# Patient Record
Sex: Female | Born: 1975 | Race: White | Hispanic: No | Marital: Single
Health system: Southern US, Community
[De-identification: ages and names within clinical notes are randomized; demographics above are authoritative.]

---

## 2003-07-05 ENCOUNTER — Other Ambulatory Visit: Payer: Self-pay

## 2004-11-12 ENCOUNTER — Emergency Department: Payer: Self-pay | Admitting: General Practice

## 2004-11-18 ENCOUNTER — Emergency Department: Payer: Self-pay | Admitting: Unknown Physician Specialty

## 2004-12-22 ENCOUNTER — Ambulatory Visit: Payer: Self-pay

## 2005-01-13 ENCOUNTER — Ambulatory Visit: Payer: Self-pay | Admitting: Surgery

## 2006-01-15 ENCOUNTER — Emergency Department: Payer: Self-pay | Admitting: Unknown Physician Specialty

## 2006-01-15 ENCOUNTER — Other Ambulatory Visit: Payer: Self-pay

## 2006-08-18 ENCOUNTER — Emergency Department: Payer: Self-pay | Admitting: Unknown Physician Specialty

## 2006-10-10 ENCOUNTER — Emergency Department: Payer: Self-pay | Admitting: Emergency Medicine

## 2007-03-12 ENCOUNTER — Emergency Department: Payer: Self-pay | Admitting: Emergency Medicine

## 2007-06-27 ENCOUNTER — Emergency Department: Payer: Self-pay | Admitting: Unknown Physician Specialty

## 2007-10-27 ENCOUNTER — Emergency Department: Payer: Self-pay | Admitting: Emergency Medicine

## 2009-09-22 ENCOUNTER — Emergency Department: Payer: Self-pay | Admitting: Internal Medicine

## 2010-04-26 ENCOUNTER — Emergency Department: Payer: Self-pay | Admitting: Emergency Medicine

## 2010-07-05 ENCOUNTER — Emergency Department: Payer: Self-pay | Admitting: Internal Medicine

## 2012-04-08 ENCOUNTER — Emergency Department: Payer: Self-pay | Admitting: Emergency Medicine

## 2013-05-22 ENCOUNTER — Observation Stay: Payer: Self-pay | Admitting: Internal Medicine

## 2013-05-22 LAB — COMPREHENSIVE METABOLIC PANEL
ALT: 24 U/L (ref 12–78)
ANION GAP: 3 — AB (ref 7–16)
Albumin: 3.4 g/dL (ref 3.4–5.0)
Alkaline Phosphatase: 71 U/L
BUN: 9 mg/dL (ref 7–18)
Bilirubin,Total: 0.3 mg/dL (ref 0.2–1.0)
Calcium, Total: 8.9 mg/dL (ref 8.5–10.1)
Chloride: 107 mmol/L (ref 98–107)
Co2: 26 mmol/L (ref 21–32)
Creatinine: 0.77 mg/dL (ref 0.60–1.30)
EGFR (African American): >60
Glucose: 119 mg/dL — ABNORMAL HIGH (ref 65–99)
Osmolality: 272 (ref 275–301)
Potassium: 4 mmol/L (ref 3.5–5.1)
SGOT(AST): 28 U/L (ref 15–37)
SODIUM: 136 mmol/L (ref 136–145)
TOTAL PROTEIN: 7.4 g/dL (ref 6.4–8.2)

## 2013-05-22 LAB — CBC WITH DIFFERENTIAL/PLATELET
BASOS PCT: 0.2 %
Basophil #: 0 10*3/uL (ref 0.0–0.1)
EOS ABS: 0 10*3/uL (ref 0.0–0.7)
Eosinophil %: 0.1 %
HCT: 41.3 % (ref 35.0–47.0)
HGB: 14.1 g/dL (ref 12.0–16.0)
LYMPHS ABS: 1.3 10*3/uL (ref 1.0–3.6)
LYMPHS PCT: 9.3 %
MCH: 33.1 pg (ref 26.0–34.0)
MCHC: 34.2 g/dL (ref 32.0–36.0)
MCV: 97 fL (ref 80–100)
Monocyte #: 0.5 x10 3/mm (ref 0.2–0.9)
Monocyte %: 3.4 %
Neutrophil #: 12.2 10*3/uL — ABNORMAL HIGH (ref 1.4–6.5)
Neutrophil %: 87 %
Platelet: 232 10*3/uL (ref 150–440)
RBC: 4.26 10*6/uL (ref 3.80–5.20)
RDW: 14.2 % (ref 11.5–14.5)
WBC: 14 10*3/uL — ABNORMAL HIGH (ref 3.6–11.0)

## 2013-05-22 LAB — CBC
HCT: 37.9 % (ref 35.0–47.0)
HGB: 13 g/dL (ref 12.0–16.0)
MCH: 32.9 pg (ref 26.0–34.0)
MCHC: 34.3 g/dL (ref 32.0–36.0)
MCV: 96 fL (ref 80–100)
Platelet: 230 10*3/uL (ref 150–440)
RBC: 3.94 10*6/uL (ref 3.80–5.20)
RDW: 13.8 % (ref 11.5–14.5)
WBC: 12 10*3/uL — ABNORMAL HIGH (ref 3.6–11.0)

## 2013-05-22 LAB — CLOSTRIDIUM DIFFICILE(ARMC)

## 2013-05-23 LAB — BASIC METABOLIC PANEL
Anion Gap: 5 — ABNORMAL LOW (ref 7–16)
BUN: 4 mg/dL — ABNORMAL LOW (ref 7–18)
CHLORIDE: 110 mmol/L — AB (ref 98–107)
CREATININE: 0.67 mg/dL (ref 0.60–1.30)
Calcium, Total: 8.2 mg/dL — ABNORMAL LOW (ref 8.5–10.1)
Co2: 26 mmol/L (ref 21–32)
EGFR (African American): >60
EGFR (Non-African Amer.): >60
Glucose: 83 mg/dL (ref 65–99)
OSMOLALITY: 277 (ref 275–301)
Potassium: 3.5 mmol/L (ref 3.5–5.1)
SODIUM: 141 mmol/L (ref 136–145)

## 2013-05-23 LAB — CBC WITH DIFFERENTIAL/PLATELET
BASOS PCT: 0.4 %
Basophil #: 0 10*3/uL (ref 0.0–0.1)
EOS ABS: 0 10*3/uL (ref 0.0–0.7)
EOS PCT: 0.3 %
HCT: 35.5 % (ref 35.0–47.0)
HGB: 11.8 g/dL — ABNORMAL LOW (ref 12.0–16.0)
Lymphocyte #: 3.3 10*3/uL (ref 1.0–3.6)
Lymphocyte %: 32.1 %
MCH: 32.4 pg (ref 26.0–34.0)
MCHC: 33.2 g/dL (ref 32.0–36.0)
MCV: 98 fL (ref 80–100)
MONO ABS: 0.8 x10 3/mm (ref 0.2–0.9)
Monocyte %: 8.2 %
NEUTROS ABS: 6 10*3/uL (ref 1.4–6.5)
Neutrophil %: 59 %
Platelet: 207 10*3/uL (ref 150–440)
RBC: 3.63 10*6/uL — ABNORMAL LOW (ref 3.80–5.20)
RDW: 14.1 % (ref 11.5–14.5)
WBC: 10.1 10*3/uL (ref 3.6–11.0)

## 2013-05-23 LAB — WBCS, STOOL

## 2013-05-23 LAB — HEMOGLOBIN: HGB: 12.2 g/dL (ref 12.0–16.0)

## 2013-05-24 LAB — CBC WITH DIFFERENTIAL/PLATELET
BASOS PCT: 0.8 %
Basophil #: 0.1 10*3/uL (ref 0.0–0.1)
Eosinophil #: 0.1 10*3/uL (ref 0.0–0.7)
Eosinophil %: 0.7 %
HCT: 36.6 % (ref 35.0–47.0)
HGB: 12.3 g/dL (ref 12.0–16.0)
Lymphocyte #: 2.5 10*3/uL (ref 1.0–3.6)
Lymphocyte %: 27.2 %
MCH: 32.6 pg (ref 26.0–34.0)
MCHC: 33.5 g/dL (ref 32.0–36.0)
MCV: 97 fL (ref 80–100)
MONO ABS: 0.9 x10 3/mm (ref 0.2–0.9)
MONOS PCT: 9.5 %
NEUTROS PCT: 61.8 %
Neutrophil #: 5.7 10*3/uL (ref 1.4–6.5)
PLATELETS: 214 10*3/uL (ref 150–440)
RBC: 3.76 10*6/uL — ABNORMAL LOW (ref 3.80–5.20)
RDW: 14 % (ref 11.5–14.5)
WBC: 9.2 10*3/uL (ref 3.6–11.0)

## 2013-05-24 LAB — STOOL CULTURE

## 2013-06-03 ENCOUNTER — Inpatient Hospital Stay: Payer: Self-pay | Admitting: Internal Medicine

## 2013-06-03 LAB — CBC WITH DIFFERENTIAL/PLATELET
Basophil #: 0.1 10*3/uL (ref 0.0–0.1)
Basophil %: 0.3 %
EOS ABS: 0.1 10*3/uL (ref 0.0–0.7)
Eosinophil %: 0.3 %
HCT: 49 % — ABNORMAL HIGH (ref 35.0–47.0)
HGB: 16 g/dL (ref 12.0–16.0)
Lymphocyte #: 0.5 10*3/uL — ABNORMAL LOW (ref 1.0–3.6)
Lymphocyte %: 2.5 %
MCH: 31.5 pg (ref 26.0–34.0)
MCHC: 32.6 g/dL (ref 32.0–36.0)
MCV: 97 fL (ref 80–100)
MONOS PCT: 2.7 %
Monocyte #: 0.6 x10 3/mm (ref 0.2–0.9)
Neutrophil #: 20.1 10*3/uL — ABNORMAL HIGH (ref 1.4–6.5)
Neutrophil %: 94.2 %
Platelet: 241 10*3/uL (ref 150–440)
RBC: 5.08 10*6/uL (ref 3.80–5.20)
RDW: 14 % (ref 11.5–14.5)
WBC: 21.3 10*3/uL — ABNORMAL HIGH (ref 3.6–11.0)

## 2013-06-03 LAB — COMPREHENSIVE METABOLIC PANEL
ALK PHOS: 69 U/L
Albumin: 4.1 g/dL (ref 3.4–5.0)
Anion Gap: 7 (ref 7–16)
BUN: 9 mg/dL (ref 7–18)
Bilirubin,Total: 0.6 mg/dL (ref 0.2–1.0)
CHLORIDE: 110 mmol/L — AB (ref 98–107)
CREATININE: 0.72 mg/dL (ref 0.60–1.30)
Calcium, Total: 9 mg/dL (ref 8.5–10.1)
Co2: 21 mmol/L (ref 21–32)
EGFR (Non-African Amer.): >60
Glucose: 110 mg/dL — ABNORMAL HIGH (ref 65–99)
Osmolality: 275 (ref 275–301)
POTASSIUM: 4 mmol/L (ref 3.5–5.1)
SGOT(AST): 18 U/L (ref 15–37)
SGPT (ALT): 16 U/L (ref 12–78)
SODIUM: 138 mmol/L (ref 136–145)
Total Protein: 8.1 g/dL (ref 6.4–8.2)

## 2013-06-03 LAB — URINALYSIS, COMPLETE
Bilirubin,UR: NEGATIVE
Glucose,UR: NEGATIVE mg/dL (ref 0–75)
Hyaline Cast: 2
LEUKOCYTE ESTERASE: NEGATIVE
Nitrite: NEGATIVE
PH: 5 (ref 4.5–8.0)
Protein: 30
RBC,UR: 6 /HPF (ref 0–5)
SPECIFIC GRAVITY: 1.024 (ref 1.003–1.030)
Squamous Epithelial: 16

## 2013-06-03 LAB — PREGNANCY, URINE: PREGNANCY TEST, URINE: NEGATIVE m[IU]/mL

## 2013-06-03 LAB — LIPASE, BLOOD: LIPASE: 173 U/L (ref 73–393)

## 2013-06-04 LAB — MAGNESIUM: Magnesium: 1.5 mg/dL — ABNORMAL LOW

## 2013-06-04 LAB — CBC WITH DIFFERENTIAL/PLATELET
BASOS ABS: 0 10*3/uL (ref 0.0–0.1)
BASOS PCT: 0.4 %
EOS PCT: 0.1 %
Eosinophil #: 0 10*3/uL (ref 0.0–0.7)
HCT: 38.4 % (ref 35.0–47.0)
HGB: 13 g/dL (ref 12.0–16.0)
LYMPHS PCT: 6.9 %
Lymphocyte #: 0.5 10*3/uL — ABNORMAL LOW (ref 1.0–3.6)
MCH: 32.3 pg (ref 26.0–34.0)
MCHC: 33.9 g/dL (ref 32.0–36.0)
MCV: 95 fL (ref 80–100)
MONO ABS: 0.1 x10 3/mm — AB (ref 0.2–0.9)
Monocyte %: 1.7 %
NEUTROS PCT: 90.9 %
Neutrophil #: 7.1 10*3/uL — ABNORMAL HIGH (ref 1.4–6.5)
PLATELETS: 165 10*3/uL (ref 150–440)
RBC: 4.03 10*6/uL (ref 3.80–5.20)
RDW: 13.8 % (ref 11.5–14.5)
WBC: 7.8 10*3/uL (ref 3.6–11.0)

## 2013-06-04 LAB — WBCS, STOOL

## 2013-06-04 LAB — BASIC METABOLIC PANEL
ANION GAP: 8 (ref 7–16)
BUN: 5 mg/dL — AB (ref 7–18)
CALCIUM: 7.9 mg/dL — AB (ref 8.5–10.1)
CO2: 21 mmol/L (ref 21–32)
Chloride: 107 mmol/L (ref 98–107)
Creatinine: 0.88 mg/dL (ref 0.60–1.30)
EGFR (African American): >60
EGFR (Non-African Amer.): >60
GLUCOSE: 86 mg/dL (ref 65–99)
Osmolality: 269 (ref 275–301)
POTASSIUM: 3.6 mmol/L (ref 3.5–5.1)
Sodium: 136 mmol/L (ref 136–145)

## 2013-06-04 LAB — OCCULT BLOOD X 1 CARD TO LAB, STOOL: Occult Blood, Feces: POSITIVE

## 2013-06-04 LAB — CLOSTRIDIUM DIFFICILE(ARMC)

## 2013-06-07 LAB — STOOL CULTURE

## 2014-08-02 NOTE — Consult Note (Signed)
PATIENT NAME:  Megan Ferguson, Megan Ferguson MR#:  [id number] DATE OF BIRTH:  March 27, 1976  DATE OF CONSULTATION:  05/23/2013  ATTENDING PHYSICIAN:  Dr. Renae Gloss CONSULTING PHYSICIAN:  Rodman Key, NP/Dr. Lutricia Feil  PRIMARY CARE PHYSICIAN:  None  REASON FOR CONSULTATION: Bloody diarrhea.  Megan Ferguson is a 39 year old Caucasian female who has essentially an unremarkable past medical history. The patient states that on Tuesday evening at around 8:00 that she started with a headache. At 2:30 that morning she woke up with severe abdominal cramping, diarrhea and vomiting occurring every 30 minutes. She states she was very weak, progressively became worse. At 4:30 a.m. states that she was so fatigued from the persistency of her GI symptoms that she ended up lying on the floor, then started with bright red blood only from her rectum. States that it was just water-like in consistency. At 8:00 that morning on the 11th, states that she continued to experience rectal bleeding with abdominal cramping bilaterally to lower abdomen. Described the pain as like having contractions. States her abdomen would get tight for a while then the pressure would subside. She has not had any evidence of rectal bleeding since yesterday or vomiting since being admitted. Continues with passage of mucus rectally with feeling of tenesmus as well. No fevers. Never has had symptoms similar to this in the past. No history of a colonoscopy or upper endoscopy.   The patient does state though that she bought pizza and chicken wings at 7:00 the evening prior to the onset of her symptoms and since being hospitalized she has found out that her sister and her nephew is also sick. Mother history of infectious colitis in the past. The patient does take BC powders as needed. The patient does confess to being sexually active inclusive of anal sex, most recent occurrence of anal sex was on Monday of this week. She is single with 3 children, ages 57, 59 and 62.   PAST  MEDICAL HISTORY: Unremarkable.   PAST SURGICAL HISTORY: Tubal ligation.   ALLERGIES: None.   MEDICATIONS: BC powders as needed, average 1 every one to two weeks. SOCIAL HISTORY: Smokes a pack of cigarettes a day.  Alcohol: Two to 3 beers a day. No history of recreational drug use. Unemployed, single, 3 children.  REVIEW OF SYSTEMS:  All 10 systems reviewed and checked, otherwise unremarkable other than what is stated above.   PHYSICAL EXAMINATION: VITAL SIGNS: Temperature 98.5 with a pulse of 59. Respirations are 18. Blood pressure is 114/76 with a pulse ox of 99% on room air.  GENERAL: Well-developed, well-nourished 39 year old Caucasian female, no acute distress noted. Pleasant. Appears to be resting comfortably in bed.  HEENT: Normocephalic, atraumatic. Pupils equal, reactive to light. Conjunctivae clear. Sclerae anicteric.  NECK: Supple. Trachea midline. No lymphadenopathy or thyromegaly.  PULMONARY: Symmetric rise and fall of chest. Clear to auscultation throughout.  CARDIOVASCULAR: Regular rhythm, S1, S2. No murmurs. No gallops.  ABDOMEN: Soft, nondistended. Bowel sounds in 4 quadrants. Marked discomfort throughout entire abdomen, more localized umbilically. No evidence of hepatosplenomegaly. No bruits. RECTAL:  Deferred. MUSCULOSKELETAL:  Moving all 4 extremities. No contractures. No clubbing.  EXTREMITIES: No edema.  PSYCHIATRIC: Alert and oriented x 4. Memory grossly intact. Appropriate affect and mood.  PSYCHIATRIC: No gross neurological deficits.   LABORATORY, DIAGNOSTIC AND RADIOLOGICAL DATA: Chemistry panel on admission: Glucose 119, anion gap is low at 3. Comparison today BUN has dropped from 9 to 4. Chloride has risen to 110. Anion gap has dropped to  5. Calcium is 8.2. Today hepatic panel within normal limits. WBC count on admission 14.0.  Neutrophil number elevated at 12.2. Yesterday evening white count was 12.0, today is normalized at 10.1.  RBC is 3.62 and hemoglobin has  dropped from 14.1 to 11.8. C. diff toxin A and B is negative. Stool comprehensive no pathologic E. coli. No Campylobacter but it is noted being held for possible pathogen.   CT scan of abdomen and pelvis with contrast was done yesterday at approximately 2:30. Lung bases are clear aside from air crystal in the right lower lobe measuring 2.1 cm. There is no significant pleural or pericardial effusion. There is a multiple loculated mass involving the interpolar region of the left kidney measuring 6.2 x 5.7 cm transverse and 4.9 cm cephalocaudad.  This mass is slightly heterogeneous in density and measures 72 HU.  The patient has reported that there has been a lesion in this area before that was 4.2 cm. Both adrenal glands appear normal. The liver, gallbladder, pancreas and spleen are normal. The renal veins and IVC appear normal. There is no retroperitoneal lymphadenopathy. Stomach, small bowel and appendix are normal. There is irregular colonic wall thickening in the region of the hepatic flexure of the colon. This appears greater than expected for incomplete distention. Findings concerning for possible renal neoplasm, as well as colitis.   The patient did have an MRI of abdomen done this afternoon and pending results.   IMPRESSION:  Acute onset of nausea, vomiting, generalized abdominal pain with bloody diarrhea. CT scan of abdomen and pelvis revealing findings consistent with colitis. Additionally abnormal finding concerning for renal mass. Differential inclusive of infectious, possible viral, possible early onset of chronic inflammatory bowel process. But again, the patient does state that the 2 of her family members who ate the same food are sick as well as this time.   PLAN: The patient's presentation will be discussed with Dr. Lutricia FeilPaul Oh and addendum will follow.   These services provided by Rodman Keyawn S. Mikeya Tomasetti, MS, APRN, [facility], FNP under collaborative agreement with Lutricia FeilPaul Oh, M.D.     ____________________________ Rodman Keyawn S. Aily Tzeng, NP dsh:ce D: 05/23/2013 14:26:03 ET T: 05/23/2013 14:44:05 ET JOB#: [id number]  cc: Rodman Keyawn S. Toma Arts, NP, <Dictator> Rodman KeyAWN S Tayna Smethurst MD ELECTRONICALLY SIGNED 05/24/2013 16:34

## 2014-08-02 NOTE — Consult Note (Signed)
PATIENT NAME:  Megan Ferguson, Megan Ferguson MR#:  [id number] DATE OF BIRTH:  Aug 31, 1975  GASTROENTEROLOGY INSuzzette Righter CONSULTATION NOTE  DATE OF CONSULTATION:  06/04/2013  REFERRING PHYSICIAN:  Dr. Renae GlossWieting.  CONSULTING PHYSICIAN:  Dow AdolphMatthew Zacharia Sowles, MD  REASON FOR THE CONSULT: Diarrhea, nausea.   HISTORY OF PRESENT ILLNESS: Megan Ferguson is a previously healthy 39 year old female who was presenting to the Emergency Room yesterday for evaluation of diarrhea, nausea and vomiting. Of note, Megan Ferguson was recently admitted to the hospital from February 11th through the 13th for acute onset of nausea, vomiting and diarrhea. During that admission, she was consulted on by Dr. Bluford Kaufmannh. This was felt to be an acute gastroenteritis and she improved while in the hospital.   Megan Ferguson reports that she went home and did okay for several days. Her stools formed up some and became mushy but never became completely solid. Over the past several days, she started to develop loose watery bowel movements. The day before, she passed about 4 liquid stools. Today she has had about 2 or 3 bowel movements. She also had significant nausea and vomiting and had a difficult time keeping down fluid yesterday.   Currently while in the hospital, her diarrhea has improved some. She has had 2 bowel movements today. Also, her nausea and vomiting have completely resolved. She otherwise is overall feeling well.   PAST MEDICAL HISTORY: None.   PAST SURGICAL HISTORY: Tubal ligation.   ALLERGIES: None.   DISCHARGE MEDICATIONS: She was discharged on 05/24/2013 with the following medications. She was on Cipro 500 mg two times a day for 8 days, Flagyl 500 mg 3 times a day for 8 days and Vicodin 1 tablet three times a day as needed.   SOCIAL HISTORY: She is a current smoker. She drinks about 2 beers per day.   REVIEW OF SYSTEMS: A 10-system review was conducted. It was negative except as stated in the HPI.   PHYSICAL EXAMINATION:  VITAL SIGNS: Current temperature  is 98.4. Pulse is 77. Blood pressure is 102/68. Her respirations are 14. Her pulse ox is 98% on 2 liters.  GENERAL: Alert and oriented times 4.  No acute distress. Appears stated age. HEENT: Normocephalic/atraumatic. Extraocular movements are intact. Anicteric. NECK: Soft, supple. JVP appears normal. No adenopathy. CHEST: Clear to auscultation. No wheeze or crackle. Respirations unlabored. HEART: Regular. No murmur, rub, or gallop.  Normal S1 and S2. ABDOMEN: Soft, nondistended.  Normal active bowel sounds in all four quadrants.  No organomegaly. No masses. Mild diffuse tenderness to palpation.  EXTREMITIES: No swelling, well perfused. SKIN: No rash or lesion. Skin color, texture, turgor normal. NEUROLOGICAL: Grossly intact. PSYCHIATRIC: Normal tone and affect. MUSCULOSKELETAL: No joint swelling or erythema.   LABORATORIES: Sodium 136, potassium 3.6, creatinine 0.88. BUN is 5. Her liver enzymes are normal. White count was 21 yesterday and 7.8 today. Hemoglobin is 13, hematocrit is 38, platelets are 265. She had a C. diff test which was negative. She had a KUB this admission which shows nondistended thick wall and bowel loops within the left abdomen, which may represent residual changes from this patient's known recent colitis.   ASSESSMENT: Diarrhea: It is interesting that her diarrhea got better after discharge and then has worsened in the past couple days. So far her Clostridium difficile is negative. We are waiting on ova and parasites in the stool culture. I suspect this will turn out not to be infectious in etiology. It is possible that this could be a postinfectious irritable bowel  syndrome-like picture; however, it would be important to rule out infection at this time. It does not fit with ischemia or inflammatory bowel disease.   PLAN:  1.  We will obtain the results on the ova and parasites in the stool culture. I am okay with going ahead and starting loperamide 2 mg. We will start  initially q.12 and increase as necessary. I do not feel that a colonoscopy or a CT scan is indicated at this time given she actually looks quite well clinically. I would consider downgrading the ertapenem back to Cipro, Flagyl, given her overall good clinical status.  2.  Nausea and vomiting. Fortunately, these have resolved completely. She can continue to take p.r.n. Zofran as needed.   Further recommendations depending upon the findings on stool studies and response to loperamide.   Thank you for this consult.   ____________________________ Dow Adolph, MD mr:np D: 06/04/2013 15:32:43 ET T: 06/04/2013 15:57:24 ET JOB#: [id number]  cc: Dow Adolph, MD, <Dictator> Kathalene Frames MD ELECTRONICALLY SIGNED 06/11/2013 13:50

## 2014-08-02 NOTE — Discharge Summary (Signed)
PATIENT NAME:  Megan Ferguson, Megan Ferguson MR#:  [id number] DATE OF BIRTH:  06-17-1975  DATE OF ADMISSION:  06/03/2013 DATE OF DISCHARGE:  06/05/2013  PRIMARY CARE PHYSICIAN:  [facility].   FINAL DIAGNOSES: 1.  Acute gastroenteritis likely bacterial.  2.  Left kidney mass.  3.  Hypomagnesemia.  4.  Tobacco abuse.   MEDICATIONS ON DISCHARGE: Include metronidazole 500 mg 1 tablet every 8 hours for 4 more days after what you already got at home, a total of 7 days; Cipro 500 mg 1 tablet every 12 hours for 4 more days more what you already got at home; loperamide 2 mg twice a day as needed for diarrhea, acetaminophen/hydrocodone 325/5 mg 1 tablet every 8 hours as needed for pain.   DIET: Low sodium diet, regular consistency.   ACTIVITY: As tolerated.  Follow-up with Dr. Bluford Kaufmannh or Dr. Shelle Ironein, gastroenterology, as outpatient. [facility] and 1 to 2 weeks at the [facility].   HOSPITAL COURSE: The patient was admitted 06/03/2013. The patient came in with abdominal pain, nausea and vomiting, unable to keep anything down.  HOSPITAL COURSE PER PROBLEM LIST:  1.  The patient has an acute gastroenteritis likely bacterial. They were recently in the hospital on 05/22/2013 and discharged 05/24/2013. She did feel like she was getting better and then ended up getting worse and then started vomiting and was unable to keep anything else down. She was tolerating diet on 06/05/2013. Initially she was put on IV Invanz. This was switched back over to Cipro and Flagyl and she will be extended on her course. Stool studies this time and last time were negative. Still awaiting ova and parasites. The patient was seen in consultation by Dr. Shelle Ironein, gastroenterology, who did not want to do a colonoscopy, conservative management.  2.  For the patient's left kidney mass that was seen last hospitalization by Dr. Artis FlockWolfe and [facility]. [facility] was calling her while she was in the Emergency  Room here. She will have to set up contact with them. 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  3.  Hypomagnesemia. This was replaced during the hospital course.  4.  Tobacco abuse. Smoking cessation counseling done last hospitalization.   The patient was prescribed 4 more days of antibiotics on the Cipro and Flagyl, and she already has 3 days at home. I think another 7 days would be perfectly fine. It is okay to take the loperamide since the patient does not have C. difficile. I did prescribe a small amount of pain medication.  TIME SPENT ON DISCHARGE: 35 minutes.   ____________________________ Herschell Dimesichard J. Renae GlossWieting, MD rjw:ce D: 06/05/2013 16:45:00 ET T: 06/05/2013 18:18:34 ET JOB#: [id number]  cc: Herschell Dimesichard J. Renae GlossWieting, MD, <Dictator> [facility] Salley ScarletICHARD J Mallie Giambra MD ELECTRONICALLY SIGNED 06/10/2013 13:28

## 2014-08-02 NOTE — H&P (Signed)
PATIENT NAME:  Megan Ferguson, Megan Ferguson MR#:  [id number] DATE OF BIRTH:  04-Jul-1975  DATE OF ADMISSION:  06/03/2013  PRIMARY CARE PHYSICIAN:  None.    CHIEF COMPLAINT: Abdominal pain, nausea, vomiting, diarrhea.   HISTORY OF PRESENT ILLNESS: This is a 39 year old female who was recently in the hospital for acute colitis from February 11th through February 13th. She was discharged home. She felt like the food that she ate just sat in her stomach. She thought she was getting a little bit better. Her bowel movements started to form up, then she had 2 days of diarrhea, just watery diarrhea. She had 5 bowel movements while here. Nausea, unable to keep any food down, unable to take her antibiotics. Abdominal pain in the top of her abdomen described as a constant nagging pain, 2 out of 10 in intensity but sometimes rages up to a 9 out of 10 in intensity. In the ER, she was found to have an elevated white count and hospitalist services were contacted for further evaluation.   PAST MEDICAL HISTORY: Recent admission for colitis, left renal mass suspicious for renal cell carcinoma and tobacco abuse.   PAST SURGICAL HISTORY: Tubal ligation.   ALLERGIES: No known drug allergies.   MEDICATIONS: Upon discharge last time include Vicodin as needed for pain, metronidazole 500 mg q.8 hours, and Cipro 500 mg q.12 hours for completion of course.   SOCIAL HISTORY: The patient does smoke cigarettes. She was counseled on the last hospitalization. Alcohol, does drink 2 to 3 drinks per day but has not since discharge. No drug use. Currently unemployed.   FAMILY HISTORY: Mother is healthy, father unknown.   REVIEW OF SYSTEMS: CONSTITUTIONAL: Positive for headache. Positive for chills and sweats. No fever. No weight gain, no weight loss.  EYES: No blurry vision.  EARS, NOSE, MOUTH AND THROAT: Positive for runny nose. No sore throat. No difficulty swallowing.  CARDIOVASCULAR: No chest pain. No palpitations.  RESPIRATORY: No  shortness of breath. No cough. No sputum. No hemoptysis.  GASTROINTESTINAL: Positive for nausea. Positive for vomiting. No hematemesis. Positive for abdominal pain. Positive for diarrhea. No bright red blood per rectum. No melena.  GENITOURINARY: No blood in the urine, decreased urination.  MUSCULOSKELETAL: Positive for low back pain down to her knees.  INTEGUMENT: No rashes or eruptions.  NEUROLOGIC: No fainting or blackouts.  PSYCHIATRIC: No anxiety or depression.  ENDOCRINE: No thyroid problems.  HEMATOLOGIC AND LYMPHATIC:  No anemia.   PHYSICAL EXAMINATION: VITAL SIGNS: Temperature 97.4, pulse 89, respirations 20, blood pressure 129/80, pulse ox 98% on room air.  GENERAL: No respiratory distress.  EYES: Conjunctivae and lids normal. Pupils equal, round and reactive to light. Extraocular muscles intact. No nystagmus.  EARS, NOSE, MOUTH AND THROAT: Tympanic membranes: No erythema. Nasal mucosa: No erythema. Throat: No erythema. No exudate seen. Lips and gums: No lesions.  NECK: No JVD. No bruits. No lymphadenopathy. No thyromegaly. No thyroid nodules palpated.  LUNGS: Clear to auscultation. No use of accessory muscles to breathe. No rhonchi, rales or wheeze heard.  CARDIOVASCULAR: S1 and S2, tachycardic. No gallops, rubs or murmurs heard. Carotid upstroke 2+ bilaterally. No bruits. Dorsalis pedis pulses 2+ bilaterally. No edema of the lower extremity.  ABDOMEN: Soft. Positive tenderness throughout the entire abdomen, upper and lower. No organomegaly/splenomegaly. Normoactive bowel sounds. No masses felt.  LYMPHATIC: No lymph nodes in the neck.  MUSCULOSKELETAL: No clubbing, edema or cyanosis.  SKIN: No rashes or ulcers seen.  NEUROLOGIC: Cranial nerves II through XII grossly  intact. Deep tendon reflexes 2+ bilateral lower extremities.  PSYCHIATRIC: The patient is oriented to person, place and time.   LABORATORY, DIAGNOSTIC AND RADIOLOGICAL DATA:  Chest and abdomen showed nondistended,  thick-walled bowel loops within the left abdomen, may represent the residual change from the patient's recent known colitis. Urinalysis 1+ blood, trace ketones. White blood cell count 21.3, H and H 16.0 and 49.0, platelet count of 241. Glucose 110, BUN 9, creatinine 0.72, sodium 138, potassium 4.0, chloride 110, CO2 21, calcium 9.0. Liver function tests normal range. Lipase 173. Pregnancy test negative.   ASSESSMENT AND PLAN: 1.  Colitis with abdominal pain, nausea, vomiting and diarrhea. Will give symptomatic management with IV fluid hydration, IV pain medications, IV nausea medication and will give empiric antibiotics since the patient was on Cipro and Flagyl. We will switch things over to Invanz. Last time the stool studies were negative.  2.  Systemic inflammatory response syndrome with leukocytosis and tachycardia. Could be secondary to dehydration and nausea, vomiting but it could also be with the colitis. Will give empiric antibiotics with IV Invanz and send off stool studies again.  3.  Left renal mass. She is supposed to follow up at [facility]. They did leave a message for her. She will call back to schedule appointment as outpatient.  4.  Tobacco abuse. Smoking cessation counseling done on the last admission. We will give a nicotine patch.   TIME SPENT ON ADMISSION: 50 minutes.     ____________________________ Herschell Dimes. Renae Gloss, MD rjw:cs D: 06/03/2013 19:52:21 ET T: 06/03/2013 20:08:29 ET JOB#: [id number]  cc: Herschell Dimes. Renae Gloss, MD, <Dictator> Salley Scarlet MD ELECTRONICALLY SIGNED 06/10/2013 13:26

## 2014-08-02 NOTE — Discharge Summary (Signed)
PATIENT NAME:  Megan Ferguson, Megan Ferguson MR#:  [id number] DATE OF BIRTH:  February 12, 1976  DATE OF ADMISSION:  05/22/2013 DATE OF DISCHARGE:  05/24/2013  DISCHARGE DIAGNOSES: 1.  Acute infectious colitis.  2.  Syncope.  3.  Left renal mass concerning for renal cell carcinoma.  4.  Tobacco and alcohol abuse.   CONSULTANTS:  1.  Dr. Evelene CroonWolff of urology.  2.  Dr. Bluford Kaufmannh of gastroenterology.  IMAGING STUDIES DONE: Include a CT scan of the abdomen and pelvis with contrast showing some acute colitis and also left renal mass.   MRI of the abdomen with and without contrast showed 5.2 x 5.6 x 5.3 cm well circumscribed enhancing left renal mass highly suspicious for renal cell carcinoma.   ADMITTING HISTORY AND PHYSICAL AND HOSPITAL COURSE: Please see detailed H and P dictated by Dr. Renae GlossWieting. In brief, a 39 year old patient with history of prior left renal cyst versus tumor who presented to the hospital complaining of bloody diarrhea and abdominal pain. The patient was found to have acute colitis, started on antibiotics IV, and admitted to the hospitalist service. Also, the CT of the abdomen raised concern for worsening left renal tumor. The patient was seen by urology and had a MRI with and without contrast of the abdomen which showed a large 5.5 x 5.5 cm left renal mass concerning for renal cell carcinoma. Dr. Evelene CroonWolff of urology saw the patient and suggested that she needs to [facility], with Dr. Cherylin Mylaraj Pruthi of urology.   The patient was also seen by GI who suggested continuing IV antibiotics.   By the day of discharge, the patient is doing well, has not had any bloody bowel movement, no nausea or vomiting, tolerated food well, pain is well-controlled and will be discharged home. Today on examination, abdomen is mildly tender, soft, no rigidity or guarding, and bowel sounds are present.   DISCHARGE MEDICATIONS: Include:  1.  Ciprofloxacin 500 mg 2 times a day for 8 days.  2.  Metronidazole 500  mg 3 times a day for 8 days.  3.  Vicodin extra strength 7.5/300 mg 1 tablet oral 3 times a day as needed for pain.   DISCHARGE INSTRUCTIONS: Regular diet. Activity as tolerated. Follow up with primary care physician in 1 to 2 weeks and also [facility]. The patient has been given the contact details for phone number [phone].   I have stressed the importance of follow-up, although she does not need any immediate emergent surgery in [facility], but she does have [facility] possible date for her left renal mass concerning for renal cell carcinoma and have stressed this multiple times to the patient.   TIME SPENT: On day of discharge in discharge activity was 45 minutes. ____________________________ Molinda BailiffSrikar R. Consetta Cosner, MD srs:sb D: 05/24/2013 16:03:42 ET T: 05/24/2013 16:32:38 ET JOB#: [id number]  cc: Wardell HeathSrikar R. Kamaya Keckler, MD, <Dictator> [facility] Orie FishermanSRIKAR R Traylon Schimming MD ELECTRONICALLY SIGNED 05/30/2013 14:09

## 2014-08-02 NOTE — Consult Note (Signed)
Brief Consult Note: Diagnosis: Possible L renal mass.   Patient was seen by consultant.   Recommend further assessment or treatment.   Orders entered.   Discussed with Attending MD.   Comments: Schedule MRI.  Electronic Signatures: Orson ApeWolff, Raliyah Montella R (MD)  (Signed 12-Feb-15 09:19)  Authored: Brief Consult Note   Last Updated: 12-Feb-15 09:19 by Orson ApeWolff, Todd Argabright R (MD)

## 2014-08-02 NOTE — Consult Note (Signed)
Pt seen and examined. Please see Megan Ferguson's notes. Acute onset of nausea, vomiting, abd pain, and bloody diarrhea. CT shows colitis. Stool studies neg so far. Likely has infectious colitis though ischemic colitis less likely. Agree with Abx coverage. Expect overall symptoms to improve over next few days. No need for colonoscopy at this time UNLESS symptoms do not improve. WIll follow. Thanks.  Electronic Signatures: Lutricia Feilh, Tanisha Lutes (MD)  (Signed on 12-Feb-15 14:54)  Authored  Last Updated: 12-Feb-15 14:54 by Lutricia Feilh, Ronne Stefanski (MD)

## 2014-08-02 NOTE — Consult Note (Signed)
Brief Consult Note: Diagnosis: Acute onset of nausea, vomiting and generalized abdominal pain.  Rectal bleeding with evidence of bloody diarrhea.  Abnormal GI x-ray with findings of colitis involving hepatic region.  Findings of multilobulated mass involving the interpolar regiono of the left kidney.   Consult note dictated.   Discussed with Attending MD.   Comments: Patient's presentation discussed with Dr. Lutricia FeilPaul Oh.  Feel her symptoms are in correlation with infectious process.  Do not plan on proceeding with diagnostic colonoscopy as this time.  Will continue to monitor patient's presentation during hospitalization.  Pending MR of abdomen results.  In agreement with initiation of antibiotic therapy.  Electronic Signatures: Rodman KeyHarrison, Desarae Placide S (NP)  (Signed 12-Feb-15 14:50)  Authored: Brief Consult Note   Last Updated: 12-Feb-15 14:50 by Rodman KeyHarrison, Persephone Schriever S (NP)

## 2014-08-02 NOTE — Consult Note (Signed)
PATIENT NAME:  Megan Ferguson, Megan Ferguson MR#:  [id number] DATE OF BIRTH:  1975/07/30  DATE OF CONSULTATION:  05/23/2013  REFERRING PHYSICIAN:  Alford Highlandichard Wieting, MD CONSULTING PHYSICIAN:  Suszanne ConnersMichael R. Evelene CroonWolff, MD  REASON FOR CONSULTATION: Renal mass.   HISTORY OF PRESENT ILLNESS: Megan Ferguson is a 39 year old Caucasian female admitted to the hospital with abdominal pain, nausea and diarrhea. CT scan was performed February 11 indicating probable colitis involving the hepatic flexure and a soft tissue density involving the left kidney. There was questionable enlargement when compared to CT performed over 14 years ago. The patient denied hematuria or flank pain.   ALLERGIES: No drug allergies.   MEDICATIONS: No medications.   PAST SURGICAL HISTORY: Tubal ligation.   SOCIAL HISTORY: She smokes a pack a day and has a 20 pack-year history. She consumes 2 to 3 alcoholic beverages daily.   FAMILY HISTORY: Negative for urologic disease.   PAST AND CURRENT MEDICAL CONDITIONS: Negative.  REVIEW OF SYSTEMS: The patient denied dysuria, hematuria or flank pain. She denied history of kidney stones.   CT scan report dated 02/11 was reviewed.   PERTINENT LABORATORY STUDIES: Include BUN 4 and creatinine of 0.67.   IMPRESSION: Questionable mass of the left kidney.   SUGGESTIONS:  1.  Obtain MRI to determine whether this is an angiomyolipoma versus a renal cell carcinoma.  2.  We will follow up on those results.   ____________________________ Suszanne ConnersMichael R. Evelene CroonWolff, MD [id number] D: 05/23/2013 09:18:06 ET T: 05/23/2013 04:54:0909:26:28 ET JOB#: [id number]  cc: Suszanne ConnersMichael R. Evelene CroonWolff, MD, <Dictator> Orson ApeMICHAEL R WOLFF MD ELECTRONICALLY SIGNED 05/23/2013 15:38

## 2014-08-02 NOTE — Consult Note (Signed)
Chief Complaint:  Subjective/Chief Complaint Feeling better. Less abd pain and diarrhea. Hungry.   VITAL SIGNS/ANCILLARY NOTES: **Vital Signs.:   13-Feb-15 04:38  Vital Signs Type Routine  Temperature Temperature (F) 98  Celsius 36.6  Temperature Source oral  Pulse Pulse 60  Respirations Respirations 18  Systolic BP Systolic BP 98  Diastolic BP (mmHg) Diastolic BP (mmHg) 57  Mean BP 70  Pulse Ox % Pulse Ox % 98  Pulse Ox Activity Level  At rest  Oxygen Delivery Room Air/ 21 %   Brief Assessment:  GEN no acute distress   Cardiac Regular   Respiratory clear BS   Gastrointestinal mild tenderness   Lab Results: Routine Hem:  13-Feb-15 06:10   WBC (CBC) 9.2  RBC (CBC)  3.76  Hemoglobin (CBC) 12.3  Hematocrit (CBC) 36.6  Platelet Count (CBC) 214  MCV 97  MCH 32.6  MCHC 33.5  RDW 14.0  Neutrophil % 61.8  Lymphocyte % 27.2  Monocyte % 9.5  Eosinophil % 0.7  Basophil % 0.8  Neutrophil # 5.7  Lymphocyte # 2.5  Monocyte # 0.9  Eosinophil # 0.1  Basophil # 0.1 (Result(s) reported on 24 May 2013 at 06:45AM.)   Assessment/Plan:  Assessment/Plan:  Assessment Infectious diarrhea. Clinically improving.   Plan Continue Abx. Low residue diet. If tolerates solids, ok for discharge on oral Abx by tomorrow. Thanks.   Electronic Signatures: Lutricia Feilh, Linard Daft (MD)  (Signed 13-Feb-15 07:46)  Authored: Chief Complaint, VITAL SIGNS/ANCILLARY NOTES, Brief Assessment, Lab Results, Assessment/Plan   Last Updated: 13-Feb-15 07:46 by Lutricia Feilh, Gaurav Baldree (MD)

## 2014-08-02 NOTE — H&P (Signed)
PATIENT NAME:  Megan RighterWADE, Aadhira M MR#:  [id number] DATE OF BIRTH:  1975/07/25  DATE OF ADMISSION:  05/22/2013  PRIMARY CARE PHYSICIAN: Currently none.  CHIEF COMPLAINT: Bloody diarrhea.   HISTORY OF PRESENT ILLNESS: This is a 39 year old female who last night started developing nausea, vomiting, and diarrhea starting at 2:00 a.m., and then starting at 5:00 a.m. straight blood from the rectum, cramping abdominal pain, 6 out of 10 in intensity, relentless. She has been having hot flashes, sweating, and cold feeling. She actually passed out 2 times. One month ago, she did have a bacterial infection in her vagina that required antibiotics. No recent travel out of the country. No sick contacts.   PAST MEDICAL HISTORY: None.   PAST SURGICAL HISTORY: Tubal ligation.   ALLERGIES: No known drug allergies.   MEDICATIONS: On a daily basis none.   SOCIAL HISTORY: Smoker, 1 pack per day. Does drink 2 to 3 beers per day. No drug use. Currently unemployed.   FAMILY HISTORY: Mother is healthy. Father unknown.  REVIEW OF SYSTEMS:    CONSTITUTIONAL: Positive for hot flashes. Positive for sweating. Positive for fatigue. No weight gain. No weight loss.  EYES: Does wear glasses.  EARS, NOSE, MOUTH, AND THROAT: Positive for runny nose. No sore throat. No difficulty swallowing.  CARDIOVASCULAR: No chest pain. No palpitations.  RESPIRATORY: No shortness of breath. No cough. No sputum. No hemoptysis.  GASTROINTESTINAL: Positive for nausea. Positive for vomiting. No hematemesis. Positive for abdominal pain, cramping in nature. Positive for bloody diarrhea.  GENITOURINARY: No burning on urination. No hematuria.  MUSCULOSKELETAL: Positive for joint pains, knees, elbows and fingers.  PSYCHIATRIC: No anxiety or depression.  NEUROLOGIC: Positive for passing out twice last night.  ENDOCRINE: No thyroid problems.  HEMATOLOGIC AND LYMPHATIC: No anemia. No easy bruising or bleeding.   PHYSICAL EXAMINATION:  VITAL SIGNS:  Temperature 97.6, pulse 67, respirations 18, blood pressure 124/83, pulse oximetry 98% on room air.  GENERAL: No respiratory distress.  EYES: Conjunctivae and lids normal. Pupils equal, round, and reactive to light. Extraocular muscles intact. No nystagmus.  EARS, NOSE, MOUTH, AND THROAT: Tympanic membranes: No erythema. Nasal mucosa: No erythema. Throat: No erythema. No exudate seen. Lips and gums: No lesions.  NECK: No JVD. No bruits. No lymphadenopathy. No thyromegaly. No thyroid nodules palpated.  LUNGS: Clear to auscultation. No use of accessory muscles to breathe. No rhonchi, rales, or wheeze heard.  CARDIOVASCULAR: S1, S2 normal. No gallops, rubs, or murmurs heard. Carotid upstroke 2+ bilaterally. No bruits. Dorsalis pedis pulses 2+ bilaterally. No edema of the lower extremity.  ABDOMEN: Soft. Slight tenderness throughout. No organomegaly, splenomegaly. Normoactive bowel sounds. No masses felt.  LYMPHATIC: No lymph nodes in the neck.  MUSCULOSKELETAL: No clubbing, edema, or cyanosis.  SKIN: No rashes or ulcers seen.  NEUROLOGIC: Cranial nerves II through XII grossly intact. Deep tendon reflexes 2+ bilateral lower extremities.  PSYCHIATRIC: The patient is oriented to person, place, and time.   LABORATORY AND RADIOLOGICAL DATA: Glucose 119, BUN 9, creatinine 0.77, sodium 136, potassium 4.0, chloride 107, CO2 of 26, calcium 8.9. Liver function tests: Normal range. White blood cell count 14.0, hemoglobin and hematocrit 14.1 and 41.3, platelet count of 232. Stool for C. difficile was negative. Stool culture comprehensive: No Campylobacter detected. CT scan of the abdomen and pelvis showed irregular colonic wall thickening around the hepatic flexure, most consistent with focal colitis in the setting of bloody diarrhea. Infiltrating mass cannot be excluded. A soft tissue density mass involving the interpolar  region of the left kidney, enlarged from prior CT report, renal cell carcinoma until proven  otherwise. Looking back 3 years here, I do not have another CT scan to measure.   ASSESSMENT AND PLAN: 1.  Bloody diarrhea: We will give IV fluid hydration and continue to monitor. Send off stool for leukocytes and stool for ova and parasites. Wait for the full comprehensive study to come back. Hold off on antibiotics at this time. I do not like to treat bloody diarrhea with antibiotics.  2.  Syncope secondary to diarrhea: Will continue to monitor.  3.  Renal mass: I did speak with Dr. Lonna Cobb to see the patient in consultation. The patient will end up needing an MRI with and without contrast for further evaluation.  4.  Tobacco abuse: Smoking cessation counseling done 3 minutes by me. Nicotine patch refused.  TIME SPENT ON ADMISSION: 50 minutes.    ____________________________ Herschell Dimes. Renae Gloss, MD [id number] D: 05/22/2013 15:56:26 ET T: 05/22/2013 16:30:38 ET JOB#: [id number]  cc: Herschell Dimes. Renae Gloss, MD, <Dictator> Salley Scarlet MD ELECTRONICALLY SIGNED 06/01/2013 12:22

## 2015-05-06 IMAGING — CT CT ABD-PELV W/ CM
2 of 4 series · 15 of 46 positions shown, 17 images · IV contrast (agent unspecified)
Comparison: None available. The report from a prior study dated
12/01/2003 is correlated.

CLINICAL DATA: Low abdominal pain.  Bloody diarrhea.

EXAM:
CT ABDOMEN AND PELVIS WITH CONTRAST
TECHNIQUE: Multidetector CT imaging of the abdomen and pelvis was performed
using the standard protocol following bolus administration of
intravenous contrast.
CONTRAST:  100 ml [id number].

[Series 2: routine abd pel with · axial · 0.62mm/px · z∈[-498,-108]mm · 12 of 90 slices shown, 14 images]
[im 8/90  soft-tissue]
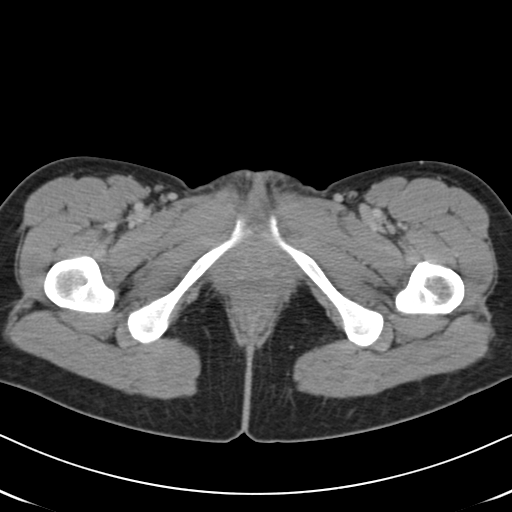
[im 8/90  bone]
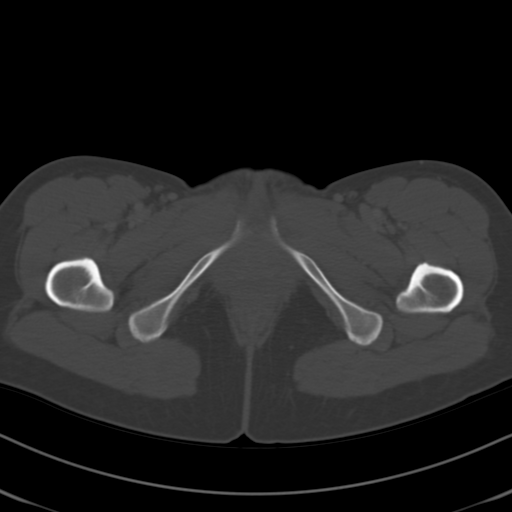
[im 15/90  soft-tissue]
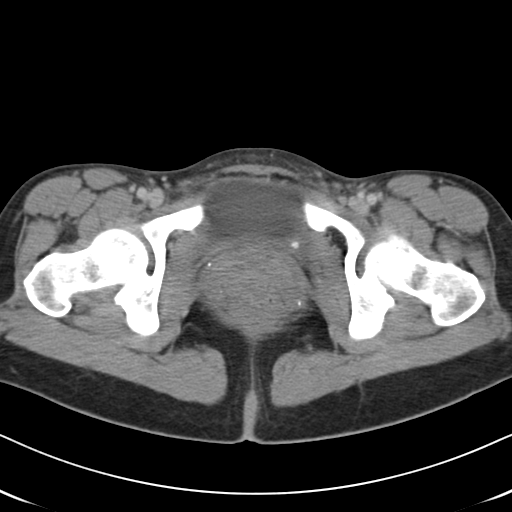
[im 22/90  soft-tissue]
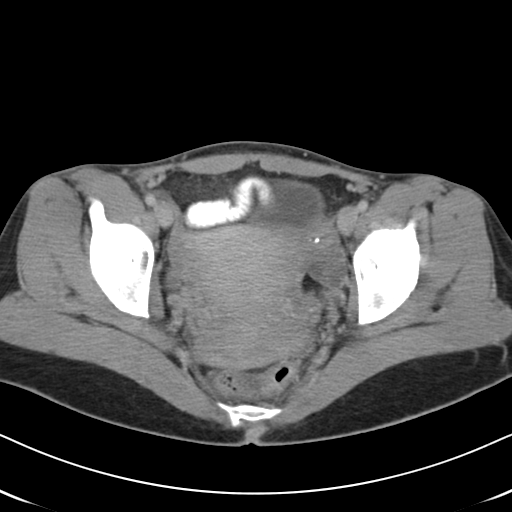
[im 29/90  soft-tissue]
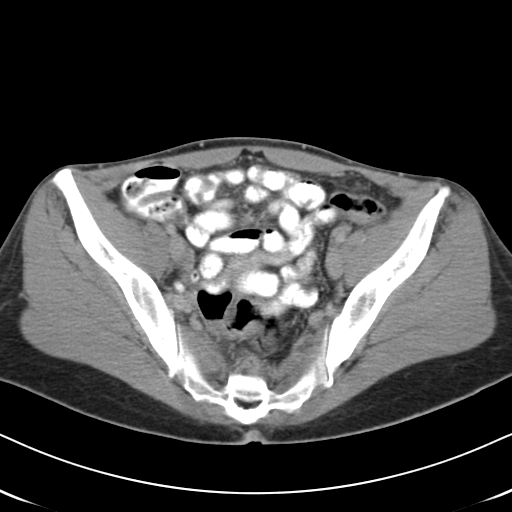
[im 36/90  soft-tissue]
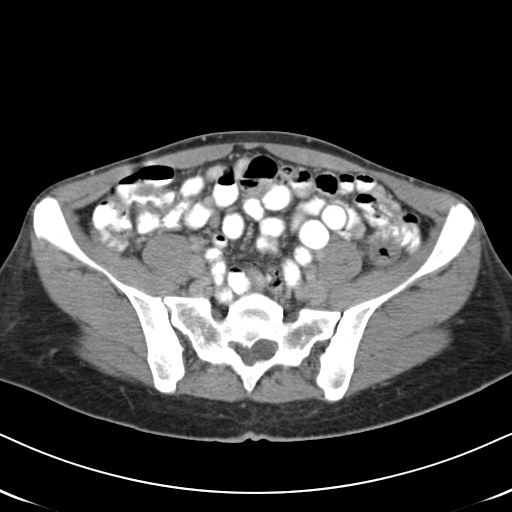
[im 43/90  soft-tissue]
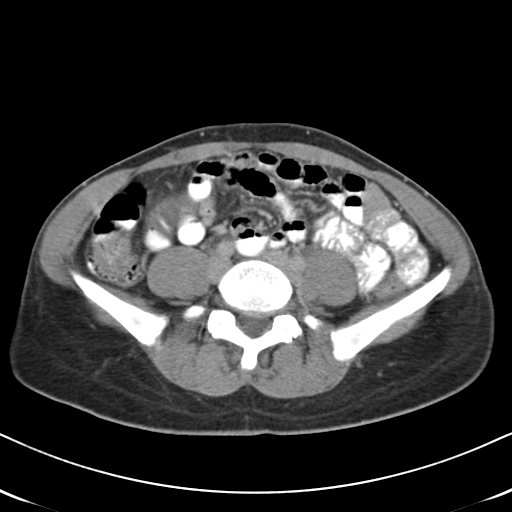
[im 50/90  soft-tissue]
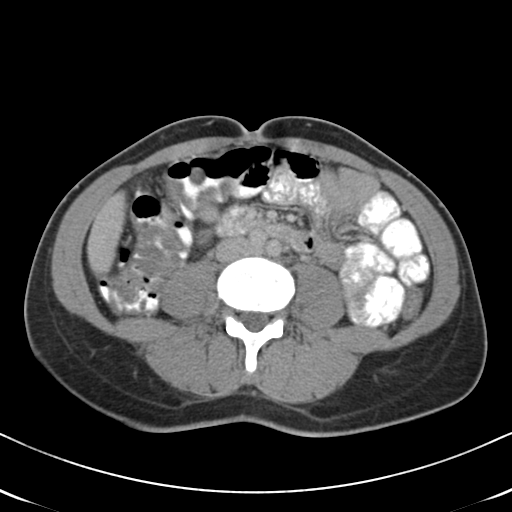
[im 57/90  soft-tissue]
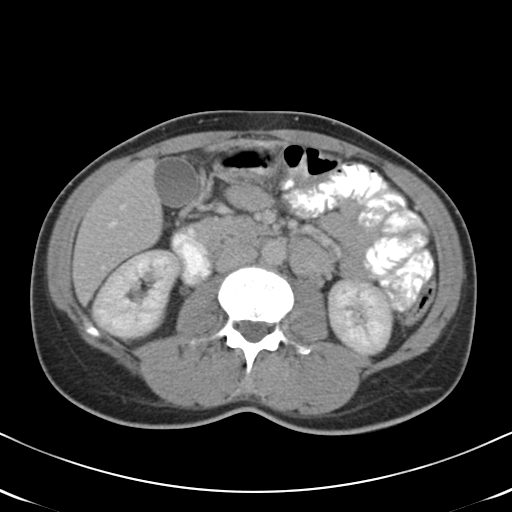
[im 65/90  soft-tissue]
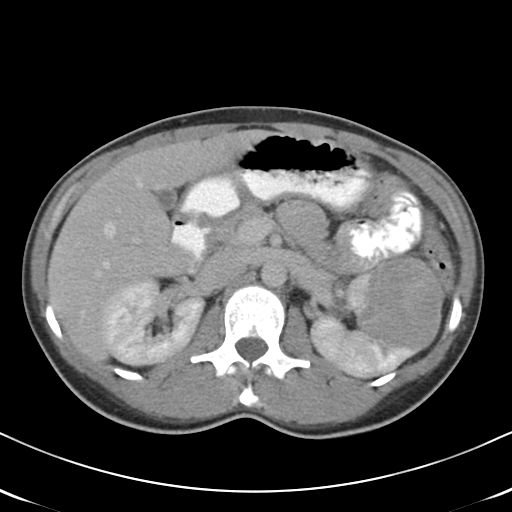
[im 65/90  bone]
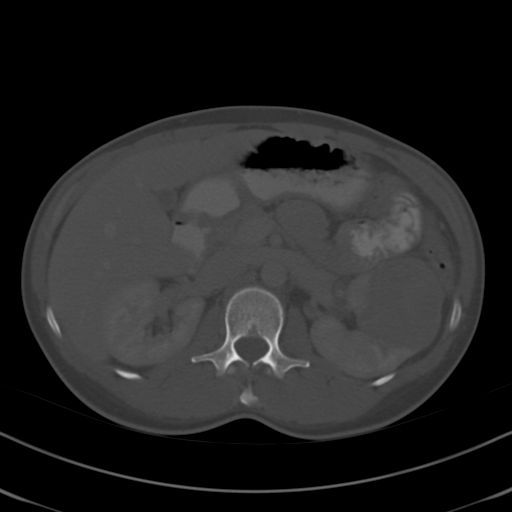
[im 72/90  soft-tissue]
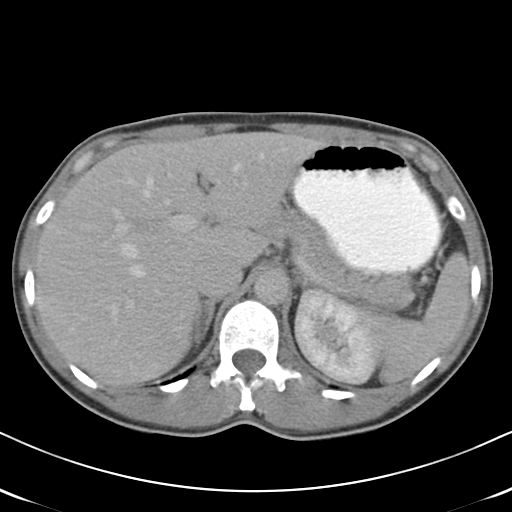
[im 79/90  soft-tissue]
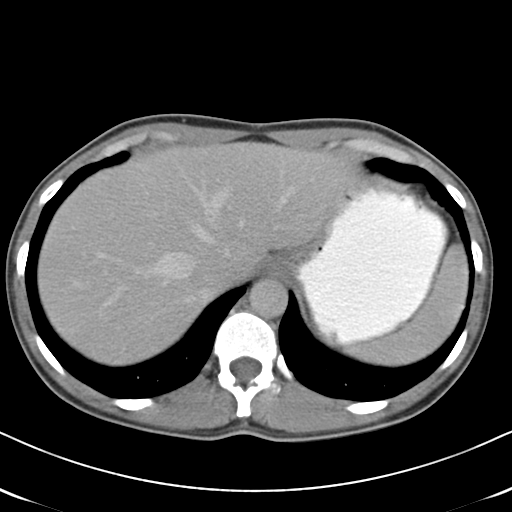
[im 86/90  soft-tissue]
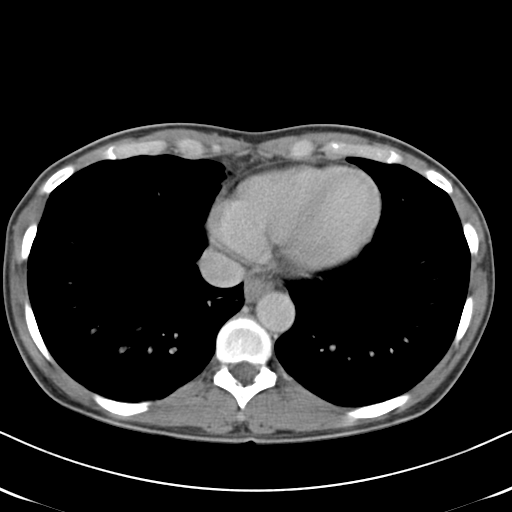

[Series 5: cor routine abd pel with · coronal · 0.65mm/px · 3 of 110 slices shown]
[im 37/110  soft-tissue]
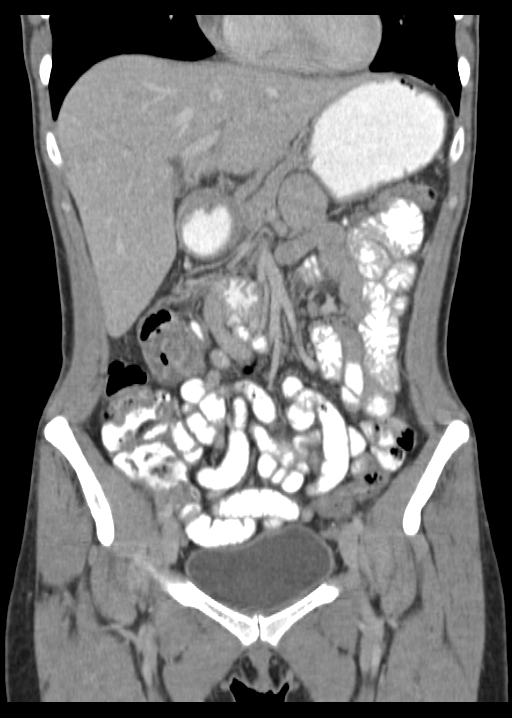
[im 49/110  soft-tissue]
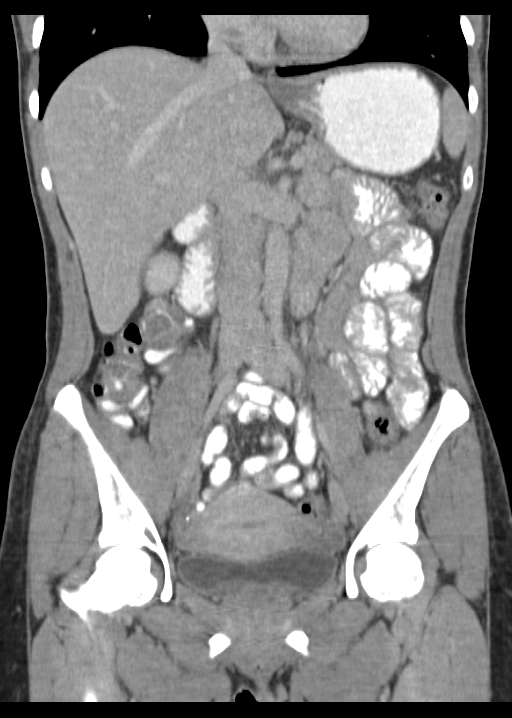
[im 61/110  soft-tissue]
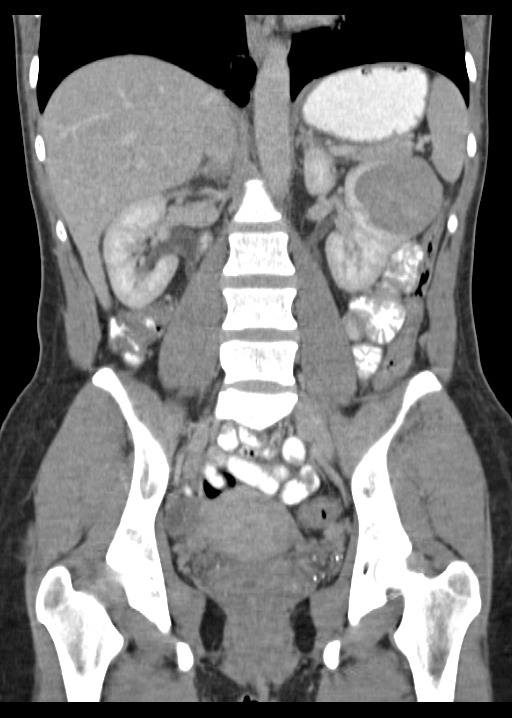

[15 of 46 positions shown; findings below may reference images not displayed]

FINDINGS: The lung bases are clear aside from an air cyst in the right lower
lobe measuring 2.1 cm on image 3. There is no significant pleural or
pericardial effusion.

There is a multilobulated mass involving the interpolar region of
the left kidney. This measures 6.2 x 5.7 cm transverse and 4.9 cm
cephalocaudad. This mass is slightly heterogeneous in density and
measures 72 HU. The patient was reported to have a lesion in this
area measuring 4.1 cm previously. No other renal lesions are
identified. There is no hydronephrosis. Both adrenal glands appear
normal.

The liver, gallbladder, pancreas and spleen appear normal.

The renal veins and IVC appear normal. No retroperitoneal
lymphadenopathy is identified. The stomach, small bowel and appendix
appear normal. There is irregular colonic wall thickening in the
region of the hepatic flexure of the colon. This appears greater
than expected for incomplete distention. No surrounding inflammatory
change is identified. The distal colon appears normal.

The uterus, ovaries and urinary bladder appear normal. There is no
free pelvic fluid. Multiple pelvic phleboliths are noted.

There are no worrisome osseous findings.
IMPRESSION: 1. Irregular with colonic wall thickening around the hepatic flexure
most consistent with focal colitis. In the setting of bloody
diarrhea, an infiltrating mass cannot be excluded, and follow-up
colonoscopy is necessary. There is no evidence of bowel obstruction
or perforation.
2. Soft tissue density mass involving the interpolar region of the
left kidney has enlarged from prior CT's by report. This lesion is
worrisome for neoplasm. This is a renal cell carcinoma until proven
otherwise. Other considerations would include lipid poor
angiomyolipoma. Renal MRI without and with contrast recommended for
further evaluation. In addition, urology consultation recommended.
3. No evidence of metastatic disease or other significant findings.
4. These results were called by telephone at the time of
interpretation on 05/22/2013 at [DATE] to Dr. RAMIREZ GANT , who
verbally acknowledged these results.
